# Patient Record
Sex: Female | Born: 1945 | Race: White | Hispanic: No | Smoking: Never smoker
Health system: Southern US, Community
[De-identification: ages and names within clinical notes are randomized; demographics above are authoritative.]

## PROBLEM LIST (undated history)

## (undated) DIAGNOSIS — E7800 Pure hypercholesterolemia, unspecified: Secondary | ICD-10-CM

## (undated) DIAGNOSIS — I10 Essential (primary) hypertension: Secondary | ICD-10-CM

---

## 1997-08-30 ENCOUNTER — Ambulatory Visit (HOSPITAL_COMMUNITY): Admission: RE | Admit: 1997-08-30 | Discharge: 1997-08-30 | Payer: Self-pay | Admitting: Orthopedic Surgery

## 1999-03-13 ENCOUNTER — Other Ambulatory Visit: Admission: RE | Admit: 1999-03-13 | Discharge: 1999-03-13 | Payer: Self-pay | Admitting: *Deleted

## 2000-01-24 ENCOUNTER — Ambulatory Visit (HOSPITAL_BASED_OUTPATIENT_CLINIC_OR_DEPARTMENT_OTHER): Admission: RE | Admit: 2000-01-24 | Discharge: 2000-01-24 | Payer: Self-pay | Admitting: Surgery

## 2000-01-24 ENCOUNTER — Encounter (INDEPENDENT_AMBULATORY_CARE_PROVIDER_SITE_OTHER): Payer: Self-pay | Admitting: *Deleted

## 2000-05-08 ENCOUNTER — Other Ambulatory Visit: Admission: RE | Admit: 2000-05-08 | Discharge: 2000-05-08 | Payer: Self-pay | Admitting: *Deleted

## 2001-09-03 ENCOUNTER — Other Ambulatory Visit: Admission: RE | Admit: 2001-09-03 | Discharge: 2001-09-03 | Payer: Self-pay | Admitting: *Deleted

## 2003-01-17 ENCOUNTER — Other Ambulatory Visit: Admission: RE | Admit: 2003-01-17 | Discharge: 2003-01-17 | Payer: Self-pay | Admitting: *Deleted

## 2004-01-24 ENCOUNTER — Other Ambulatory Visit: Admission: RE | Admit: 2004-01-24 | Discharge: 2004-01-24 | Payer: Self-pay | Admitting: *Deleted

## 2005-01-09 ENCOUNTER — Ambulatory Visit: Payer: Self-pay | Admitting: Gastroenterology

## 2005-01-18 ENCOUNTER — Ambulatory Visit: Payer: Self-pay | Admitting: Gastroenterology

## 2005-01-23 ENCOUNTER — Other Ambulatory Visit: Admission: RE | Admit: 2005-01-23 | Discharge: 2005-01-23 | Payer: Self-pay | Admitting: *Deleted

## 2006-12-03 ENCOUNTER — Other Ambulatory Visit: Admission: RE | Admit: 2006-12-03 | Discharge: 2006-12-03 | Payer: Self-pay | Admitting: *Deleted

## 2014-05-27 ENCOUNTER — Encounter (HOSPITAL_COMMUNITY): Payer: Self-pay

## 2014-05-27 ENCOUNTER — Emergency Department (INDEPENDENT_AMBULATORY_CARE_PROVIDER_SITE_OTHER): Payer: 59

## 2014-05-27 ENCOUNTER — Emergency Department (HOSPITAL_COMMUNITY)
Admission: EM | Admit: 2014-05-27 | Discharge: 2014-05-27 | Disposition: A | Discharge status: Home / Self Care | Payer: 59 | Source: Home / Self Care | Attending: Family Medicine | Admitting: Family Medicine

## 2014-05-27 DIAGNOSIS — S52602A Unspecified fracture of lower end of left ulna, initial encounter for closed fracture: Secondary | ICD-10-CM

## 2014-05-27 MED ORDER — HYDROCODONE-ACETAMINOPHEN 5-325 MG PO TABS: 1.0000 | ORAL_TABLET | ORAL | Status: DC | PRN

## 2014-05-27 MED ORDER — HYDROCODONE-ACETAMINOPHEN 5-325 MG PO TABS
ORAL_TABLET | ORAL | Status: AC
  Filled 2014-05-27: qty 1

## 2014-05-27 MED ORDER — HYDROCODONE-ACETAMINOPHEN 5-325 MG PO TABS
1.0000 | ORAL_TABLET | Freq: Once | ORAL | Status: AC
  Administered 2014-05-27: 1 via ORAL

## 2014-05-27 NOTE — Progress Notes (Signed)
Orthopedic Tech Progress Note Patient Details:  Alicia James 02/07/1946 [id number] Applied fiberglass ulnar gutter splint to LUE.  Pulses, sensation, motion intact before and after splinting.  Capillary refill less than 2 seconds before and after splinting.  Placed splinted LUE in arm sling. Ortho Devices Type of Ortho Device: Ulna gutter splint Ortho Device/Splint Location: LUE Ortho Device/Splint Interventions: Application   Lesle ChrisGilliland, Abdoulaye Drum L 05/27/2014, 4:42 PM

## 2014-05-27 NOTE — ED Provider Notes (Signed)
CSN: [id number]     Arrival date & time 05/27/14  1357 History   First MD Initiated Contact with Patient 05/27/14 1434     Chief Complaint  Patient presents with  . Fall   (Consider location/radiation/quality/duration/timing/severity/associated sxs/prior Treatment) HPI Comments: 69 year old female playing with her dog this morning and she tripped and fell on her left outstretched arm. She is complaining of pain primarily to the wrist. She denies injuring her head, neck, chest, back, right upper extremity or lower extremities. She is fully awake and alert and oriented.   History reviewed. No pertinent past medical history. History reviewed. No pertinent past surgical history. History reviewed. No pertinent family history. History  Substance Use Topics  . Smoking status: Never Smoker   . Smokeless tobacco: Not on file  . Alcohol Use: No   OB History    No data available     Review of Systems  Constitutional: Negative.   Respiratory: Negative for cough and shortness of breath.   Cardiovascular: Negative for chest pain.  Gastrointestinal: Negative.   Musculoskeletal: Positive for joint swelling and arthralgias. Negative for neck pain and neck stiffness.       As per history of present illness  Neurological: Negative for dizziness, tremors, syncope, speech difficulty, weakness and headaches.    Allergies  Review of patient's allergies indicates no known allergies.  Home Medications   Prior to Admission medications   Medication Sig Start Date End Date Taking? Authorizing Provider  HYDROcodone-acetaminophen (NORCO/VICODIN) 5-325 MG per tablet Take 1 tablet by mouth every 4 (four) hours as needed. 05/27/14   Hayden Rasmussenavid Godson Pollan, NP   BP 167/75 mmHg  Pulse 72  Temp(Src) 98.4 F (36.9 C) (Oral)  Resp 14  SpO2 96% Physical Exam  Constitutional: She is oriented to person, place, and time. She appears well-developed and well-nourished. No distress.  Eyes: Conjunctivae and EOM are normal.   Neck: Normal range of motion. Neck supple.  Pulmonary/Chest: Effort normal. No respiratory distress.  Musculoskeletal:  Tenderness to the ulnar aspect of the left wrist. Minor local swelling. Patient is able to partially flex the wrist but unable to hyperextend it. Radial pulses 2+. Ulnar pulse 1+. Is able to flex all digits and make a fist. Extension of digits is normal. No tenderness to the hand or forearm. No elbow tenderness. Elbow range of motion is complete. Distal neurovascular motor Sentry is intact.  Neurological: She is alert and oriented to person, place, and time. She exhibits normal muscle tone.  Psychiatric: She has a normal mood and affect.  Nursing note and vitals reviewed.   ED Course  Procedures (including critical care time) Labs Review Labs Reviewed - No data to display  Imaging Review Dg Wrist Complete Left  05/27/2014   CLINICAL DATA:  Fall wall line with followup  EXAM: LEFT WRIST - COMPLETE 3+ VIEW  COMPARISON:  None.  FINDINGS: No fracture of the distal radius.  Radiocarpal joint is intact.  There is swelling of the distal ulna laterally. There is a small ossific fragment between the ulna and the carpal row. Suspicion for avulsion fracture. Donor site is not identified.  IMPRESSION: Avulsion fracture between the distal ulna and carpal roe without clear donor site.   Electronically Signed   By: Genevive BiStewart  Edmunds M.D.   On: 05/27/2014 15:25     MDM   1. Closed fracture of ulna, distal end, left, initial encounter    Ulnar gutter splint, elevate, arm sling norco 5 mg #15 Call your  ortho or the above on call office for appt to see next week.    Hayden Rasmussen, NP 05/27/14 [phone]

## 2014-05-27 NOTE — ED Notes (Signed)
Ortho tech paged for splint application 

## 2014-05-27 NOTE — ED Notes (Addendum)
Reportedly fell earlier today  while in yard w her dog, injury w ? Deformity

## 2014-05-27 NOTE — Discharge Instructions (Signed)
Cast or Splint Care °Casts and splints support injured limbs and keep bones from moving while they heal. It is important to care for your cast or splint at home.   °HOME CARE INSTRUCTIONS °· Keep the cast or splint uncovered during the drying period. It can take 24 to 48 hours to dry if it is made of plaster. A fiberglass cast will dry in less than 1 hour. °· Do not rest the cast on anything harder than a pillow for the first 24 hours. °· Do not put weight on your injured limb or apply pressure to the cast until your health care provider gives you permission. °· Keep the cast or splint dry. Wet casts or splints can lose their shape and may not support the limb as well. A wet cast that has lost its shape can also create harmful pressure on your skin when it dries. Also, wet skin can become infected. °· Cover the cast or splint with a plastic bag when bathing or when out in the rain or snow. If the cast is on the trunk of the body, take sponge baths until the cast is removed. °· If your cast does become wet, dry it with a towel or a blow dryer on the cool setting only. °· Keep your cast or splint clean. Soiled casts may be wiped with a moistened cloth. °· Do not place any hard or soft foreign objects under your cast or splint, such as cotton, toilet paper, lotion, or powder. °· Do not try to scratch the skin under the cast with any object. The object could get stuck inside the cast. Also, scratching could lead to an infection. If itching is a problem, use a blow dryer on a cool setting to relieve discomfort. °· Do not trim or cut your cast or remove padding from inside of it. °· Exercise all joints next to the injury that are not immobilized by the cast or splint. For example, if you have a long leg cast, exercise the hip joint and toes. If you have an arm cast or splint, exercise the shoulder, elbow, thumb, and fingers. °· Elevate your injured arm or leg on 1 or 2 pillows for the first 1 to 3 days to decrease  swelling and pain. It is best if you can comfortably elevate your cast so it is higher than your heart. °SEEK MEDICAL CARE IF:  °· Your cast or splint cracks. °· Your cast or splint is too tight or too loose. °· You have unbearable itching inside the cast. °· Your cast becomes wet or develops a soft spot or area. °· You have a bad smell coming from inside your cast. °· You get an object stuck under your cast. °· Your skin around the cast becomes red or raw. °· You have new pain or worsening pain after the cast has been applied. °SEEK IMMEDIATE MEDICAL CARE IF:  °· You have fluid leaking through the cast. °· You are unable to move your fingers or toes. °· You have discolored (blue or white), cool, painful, or very swollen fingers or toes beyond the cast. °· You have tingling or numbness around the injured area. °· You have severe pain or pressure under the cast. °· You have any difficulty with your breathing or have shortness of breath. °· You have chest pain. °Document Released: 04/05/2000 Document Revised: 01/27/2013 Document Reviewed: 10/15/2012 °ExitCare® Patient Information ©2015 [employer]. This information is not intended to replace advice given to you by your health care   provider. Make sure you discuss any questions you have with your health care provider.  Ulnar Fracture You have a fracture (broken bone) of the forearm. This is the part of your arm between the elbow and your wrist. Your forearm is made up of two bones. These are the radius and ulna. Your fracture is in the ulna. This is the bone in your forearm located on the little finger side of your forearm. A cast or splint is used to protect and keep your injured bone from moving. The cast or splint will be on generally for about 5 to 6 weeks, with individual variations. HOME CARE INSTRUCTIONS   Keep the injured part elevated while sitting or lying down. Keep the injury above the level of your heart (the center of the chest). This will decrease  swelling and pain.  Apply ice to the injury for 15-20 minutes, 03-04 times per day while awake, for 2 days. Put the ice in a plastic bag and place a towel between the bag of ice and your cast or splint.  Move your fingers to avoid stiffness and minimize swelling.  If you have a plaster or fiberglass cast:  Do not try to scratch the skin under the cast using sharp or pointed objects.  Check the skin around the cast every day. You may put lotion on any red or sore areas.  Keep your cast dry and clean.  If you have a plaster splint:  Wear the splint as directed.  You may loosen the elastic around the splint if your fingers become numb, tingle, or turn cold or blue.  Do not put pressure on any part of your cast or splint. It may break. Rest your cast only on a pillow the first 24 hours until it is fully hardened.  Your cast or splint can be protected during bathing with a plastic bag. Do not lower the cast or splint into water.  Only take over-the-counter or prescription medicines for pain, discomfort, or fever as directed by your caregiver. SEEK IMMEDIATE MEDICAL CARE IF:   Your cast gets damaged or breaks.  You have more severe pain or swelling than you did before the cast.  You have severe pain when stretching your fingers.  There is a bad smell or new stains and/or purulent (pus like) drainage coming from under the cast. Document Released: 09/19/2005 Document Revised: 07/01/2011 Document Reviewed: 02/21/2007 ExitCare Patient Information 2015 [place], [place]. This information is not intended to replace advice given to you by your health care provider. Make sure you discuss any questions you have with your health care provider.  Wrist Fracture A wrist fracture is a break or crack in one of the bones of your wrist. Your wrist is made up of eight small bones at the palm of your hand (carpal bones) and two long bones that make up your forearm (radius and ulna). The goal of treatment  is to hold the injured bone in place while it heals. Surgery may or may not be needed to care for your injured wrist.  HOME CARE  Keep your injured wrist raised (elevated). Move your fingers as much as you can.  Do not put pressure on any part of your cast or splint. It may break.  Use a plastic bag to protect your cast or splint from water while bathing or showering. Do not lower your cast or splint into water.  Take medicines only as told by your doctor.  Keep your cast or splint clean  and dry. If it gets wet, damaged, or suddenly feels too tight, tell your doctor right away.  Do not use any tobacco products including cigarettes, chewing tobacco, or electronic cigarettes. Tobacco can slow bone healing. If you need help quitting, ask your doctor.  Keep all follow-up visits as told by your doctor. This is important.  Ask your doctor if you should take supplements of calcium and vitamins C and D. GET HELP IF:   Your cast or splint is damaged, breaks, or gets wet.  You have a fever.  You have chills.  You have very bad pain that does not go away.  You have more swelling (inflammation) than before the cast was put on. GET HELP RIGHT AWAY IF:   Your hand or fingernails on the injured arm turn blue or gray, or feel cold or numb.  You lose some feeling in the fingers of your injured arm. MAKE SURE YOU:   Understand these instructions.  Will watch your condition.  Will get help right away if you are not doing well or get worse. Document Released: 09/25/2007 Document Revised: 08/23/2013 Document Reviewed: 10/21/2011 [facility] Patient Information 2015 [place]. This information is not intended to replace advice given to you by your health care provider. Make sure you discuss any questions you have with your health care provider.

## 2014-05-27 NOTE — ED Notes (Signed)
Ice pack for pain/swelling

## 2014-12-12 ENCOUNTER — Encounter: Payer: Self-pay | Admitting: Gastroenterology

## 2015-01-04 ENCOUNTER — Encounter: Payer: Self-pay | Admitting: Obstetrics and Gynecology

## 2015-11-14 ENCOUNTER — Encounter: Payer: Self-pay | Admitting: Internal Medicine

## 2016-10-29 ENCOUNTER — Other Ambulatory Visit (HOSPITAL_COMMUNITY): Payer: Self-pay | Admitting: Internal Medicine

## 2016-10-29 ENCOUNTER — Ambulatory Visit (HOSPITAL_COMMUNITY)
Admission: RE | Admit: 2016-10-29 | Discharge: 2016-10-29 | Disposition: A | Discharge status: Home / Self Care | Payer: 59 | Source: Ambulatory Visit | Attending: Vascular Surgery | Admitting: Vascular Surgery

## 2016-10-29 DIAGNOSIS — H538 Other visual disturbances: Secondary | ICD-10-CM | POA: Insufficient documentation

## 2016-10-29 LAB — VAS US CAROTID
LCCADDIAS: 20 cm/s
LCCADSYS: 76 cm/s
LCCAPDIAS: 18 cm/s
LEFT ECA DIAS: -10 cm/s
LICADDIAS: -23 cm/s
LICAPDIAS: -22 cm/s
Left CCA prox sys: 95 cm/s
Left ICA dist sys: -68 cm/s
Left ICA prox sys: -82 cm/s
RCCADSYS: -92 cm/s
RCCAPDIAS: 15 cm/s
RCCAPSYS: 97 cm/s
RIGHT CCA MID DIAS: 16 cm/s
RIGHT ECA DIAS: -10 cm/s

## 2018-12-02 ENCOUNTER — Other Ambulatory Visit: Payer: Self-pay | Admitting: Internal Medicine

## 2018-12-02 DIAGNOSIS — E785 Hyperlipidemia, unspecified: Secondary | ICD-10-CM

## 2018-12-14 ENCOUNTER — Ambulatory Visit
Admission: RE | Admit: 2018-12-14 | Discharge: 2018-12-14 | Disposition: A | Discharge status: Home / Self Care | Payer: 59 | Source: Ambulatory Visit | Attending: Internal Medicine | Admitting: Internal Medicine

## 2018-12-14 DIAGNOSIS — E785 Hyperlipidemia, unspecified: Secondary | ICD-10-CM

## 2019-04-01 ENCOUNTER — Other Ambulatory Visit: Payer: Self-pay

## 2019-04-01 DIAGNOSIS — Z20822 Contact with and (suspected) exposure to covid-19: Secondary | ICD-10-CM

## 2019-04-03 LAB — NOVEL CORONAVIRUS, NAA: SARS-CoV-2, NAA: NOT DETECTED

## 2019-04-08 ENCOUNTER — Other Ambulatory Visit: Payer: 59

## 2019-04-12 ENCOUNTER — Other Ambulatory Visit: Payer: 59

## 2019-06-29 ENCOUNTER — Emergency Department (HOSPITAL_COMMUNITY)
Admission: EM | Admit: 2019-06-29 | Discharge: 2019-06-29 | Disposition: A | Discharge status: Home / Self Care | Payer: 59 | Attending: Emergency Medicine | Admitting: Emergency Medicine

## 2019-06-29 ENCOUNTER — Other Ambulatory Visit: Payer: Self-pay

## 2019-06-29 ENCOUNTER — Emergency Department (HOSPITAL_COMMUNITY): Payer: 59

## 2019-06-29 ENCOUNTER — Encounter (HOSPITAL_COMMUNITY): Payer: Self-pay

## 2019-06-29 DIAGNOSIS — Y939 Activity, unspecified: Secondary | ICD-10-CM | POA: Diagnosis not present

## 2019-06-29 DIAGNOSIS — I10 Essential (primary) hypertension: Secondary | ICD-10-CM | POA: Insufficient documentation

## 2019-06-29 DIAGNOSIS — S022XXA Fracture of nasal bones, initial encounter for closed fracture: Secondary | ICD-10-CM | POA: Diagnosis not present

## 2019-06-29 DIAGNOSIS — Y929 Unspecified place or not applicable: Secondary | ICD-10-CM | POA: Diagnosis not present

## 2019-06-29 DIAGNOSIS — S0993XA Unspecified injury of face, initial encounter: Secondary | ICD-10-CM | POA: Diagnosis present

## 2019-06-29 DIAGNOSIS — Y999 Unspecified external cause status: Secondary | ICD-10-CM | POA: Insufficient documentation

## 2019-06-29 DIAGNOSIS — W2209XA Striking against other stationary object, initial encounter: Secondary | ICD-10-CM | POA: Diagnosis not present

## 2019-06-29 HISTORY — DX: Essential (primary) hypertension: I10

## 2019-06-29 HISTORY — DX: Pure hypercholesterolemia, unspecified: E78.00

## 2019-06-29 MED ORDER — BACITRACIN ZINC 500 UNIT/GM EX OINT: TOPICAL_OINTMENT | Freq: Two times a day (BID) | CUTANEOUS | Status: DC

## 2019-06-29 MED ORDER — BACITRACIN ZINC 500 UNIT/GM EX OINT
TOPICAL_OINTMENT | CUTANEOUS | Status: AC
  Filled 2019-06-29: qty 0.9

## 2019-06-29 NOTE — ED Provider Notes (Signed)
[facility]-EMERGENCY DEPT Provider Note   CSN: [id number] Arrival date & time: 06/29/19  1151     History Chief Complaint  Patient presents with  . Facial Injury    Alicia James is a 74 y.o. female with a past medical history of HTN, HLD, who presents to ED with a chief complaint of nose injury.  States that she [place] in [place] son's birthday when she was looking down at her phone and ran into a door.  States that she hit her nose on the side of the door. Did blow her nose once with some streaks of blood but denies any other nosebleed.  Denies any trouble breathing through her nose.  Denies any loss of consciousness, vomiting, vision changes or anticoagulant use.  States that her last tetanus shot was within the past 5 years.  HPI     Past Medical History:  Diagnosis Date  . High cholesterol   . Hypertension     There are no problems to display for this patient.   History reviewed. No pertinent surgical history.   OB History   No obstetric history on file.     History reviewed. No pertinent family history.  Social History   Tobacco Use  . Smoking status: Never Smoker  . Smokeless tobacco: Never Used  Substance Use Topics  . Alcohol use: Yes  . Drug use: Never    Home Medications Prior to Admission medications   Medication Sig Start Date End Date Taking? Authorizing Provider  HYDROcodone-acetaminophen (NORCO/VICODIN) 5-325 MG per tablet Take 1 tablet by mouth every 4 (four) hours as needed. 05/27/14   Hayden Rasmussen, NP    Allergies    Other and Penicillins  Review of Systems   Review of Systems  Gastrointestinal: Negative for nausea and vomiting.  Skin: Positive for wound.  Neurological: Negative for syncope, light-headedness and headaches.    Physical Exam Updated Vital Signs BP (!) 148/72   Pulse 92   Temp 98.3 F (36.8 C) (Oral)   Resp 16   Ht 5' 2.5" (1.588 m)   Wt 52.2 kg   SpO2  100%   BMI 20.70 kg/m   Physical Exam Vitals and nursing note reviewed.  Constitutional:      General: She is not in acute distress.    Appearance: She is well-developed. She is not diaphoretic.  HENT:     Head: Normocephalic and atraumatic.     Right Ear: Tympanic membrane normal.     Left Ear: Tympanic membrane normal.     Nose:      Comments: 1cm linear abrasion on the nose without active bleeding. Eyes:     General: No scleral icterus.    Conjunctiva/sclera: Conjunctivae normal.  Pulmonary:     Effort: Pulmonary effort is normal. No respiratory distress.  Musculoskeletal:     Cervical back: Normal range of motion.  Skin:    Findings: No rash.  Neurological:     Mental Status: She is alert.     ED Results / Procedures / Treatments   Labs (all labs ordered are listed, but only abnormal results are displayed) Labs Reviewed - No data to display  EKG None  Radiology CT Maxillofacial Wo Contrast  Result Date: 06/29/2019 CLINICAL DATA:  Nasal trauma, struck side of door with nose. EXAM: CT MAXILLOFACIAL WITHOUT CONTRAST TECHNIQUE: Multidetector CT imaging of the maxillofacial structures was performed. Multiplanar CT image reconstructions were also  generated. COMPARISON:  None. FINDINGS: Osseous: Nondisplaced right nasal bone fracture. Nasal septum is midline. No additional facial bone fracture. Zygomatic arches and mandibles are intact. Temporomandibular joints are congruent with degenerative change. There are multiple missing teeth, chronic appearing. Orbits: No orbital or globe injury. Sinuses: No sinus fracture or fluid level. Paranasal sinuses are clear. Mastoid air cells are clear. Soft tissues: Mild nasal soft tissue edema. Limited intracranial: No significant or unexpected finding. IMPRESSION: Nondisplaced right nasal bone fracture. Electronically Signed   By: Keith Rake M.D.   On: 06/29/2019 13:11    Procedures Procedures (including critical care  time)  Medications Ordered in ED Medications  bacitracin ointment (has no administration in time range)  bacitracin 500 UNIT/GM ointment (has no administration in time range)    ED Course  I have reviewed the triage vital signs and the nursing notes.  Pertinent labs & imaging results that were available during my care of the patient were reviewed by me and considered in my medical decision making (see chart for details).    MDM Rules/Calculators/A&P                      74 year old female presents to the ED for nose injury that occurred prior to arrival.  States that she was in a hurry and hit the bridge of her nose on the edge of a door.  Denies any loss of consciousness.  Denies headache.  Reports pain at the site of the nose.  Did have some streaks of blood when she blew her nose prior to arrival but denies any other epistaxis.  On exam patient is overall well-appearing.  Tenderness palpation of the bridge of the nose without deformities noted.  No septal hematoma noted.  There is an abrasion present on the bridge of the nose that does not appear deep.  CT shows nondisplaced right nasal bone fracture.  Patient educated on precautions regarding nasal bone fractures.  Advised on wound care and ENT follow-up.  Patient is hemodynamically stable, in NAD, and able to ambulate in the ED. Evaluation does not show pathology that would require ongoing emergent intervention or inpatient treatment. I have personally reviewed and interpreted all lab work and imaging at today's ED visit. I explained the diagnosis to the patient. Pain has been managed and has no complaints prior to discharge. Patient is comfortable with above plan and is stable for discharge at this time. All questions were answered prior to disposition. Strict return precautions for returning to the ED were discussed. Encouraged follow up with PCP.   An After Visit Summary was printed and given to the patient.   Portions of this note  were generated with [employer]. Dictation errors may occur despite best attempts at proofreading.  Final Clinical Impression(s) / ED Diagnoses Final diagnoses:  Closed fracture of nasal bone, initial encounter    Rx / DC Orders ED Discharge Orders    None       Delia Heady, PA-C 06/29/19 1357    Tegeler, Gwenyth Allegra, MD 06/29/19 1740

## 2019-06-29 NOTE — Discharge Instructions (Signed)
Use neosporin in the area. Follow-up with the ENT specialist listed below regarding your nasal fracture. You will need to refrain from blowing your nose. Return to the ER if you have additional head injuries, numbness in arms or legs, blurry vision, chest pain or shortness of breath.

## 2019-06-29 NOTE — ED Triage Notes (Signed)
Patient states that she was in a hurry and hit the side of the door with her nose. Patient denies any blood thinners.

## 2020-06-12 ENCOUNTER — Other Ambulatory Visit: Payer: Self-pay

## 2020-06-12 ENCOUNTER — Ambulatory Visit: Payer: Self-pay

## 2020-06-12 ENCOUNTER — Ambulatory Visit (INDEPENDENT_AMBULATORY_CARE_PROVIDER_SITE_OTHER): Payer: 59

## 2020-06-12 ENCOUNTER — Encounter: Payer: Self-pay | Admitting: Family Medicine

## 2020-06-12 ENCOUNTER — Ambulatory Visit (INDEPENDENT_AMBULATORY_CARE_PROVIDER_SITE_OTHER): Payer: 59 | Admitting: Family Medicine

## 2020-06-12 DIAGNOSIS — M25512 Pain in left shoulder: Secondary | ICD-10-CM

## 2020-06-12 DIAGNOSIS — M25511 Pain in right shoulder: Secondary | ICD-10-CM

## 2020-06-12 MED ORDER — COLCHICINE 0.6 MG PO CAPS
1.0000 | ORAL_CAPSULE | Freq: Two times a day (BID) | ORAL | 3 refills | Status: AC | PRN
Start: 2020-06-12 — End: ?

## 2020-06-12 MED ORDER — NABUMETONE 500 MG PO TABS: 500.0000 mg | ORAL_TABLET | Freq: Two times a day (BID) | ORAL | 3 refills | Status: AC | PRN

## 2020-06-12 NOTE — Progress Notes (Signed)
   Office Visit Note   Patient: Alicia James           Date of Birth: 1945/12/12           MRN: [id number] Visit Date: 06/12/2020 Requested by: Jarome Matin, MD [address] [place],  [place] [address] PCP: Jarome Matin, MD  Subjective: Chief Complaint  Patient presents with  . Right Shoulder - Pain    Pain bilateral shoulders, right more than left, x over a month. Decreased ROM in the right arm.  . Left Shoulder - Pain    HPI: She is here with bilateral shoulder pain.  Symptoms started about a month ago, no injury.  Pain and stiffness worse in the morning and better as the day goes on.  Heat, Tylenol and ibuprofen have given her some relief.  She has a history of osteoarthritis affecting multiple joints.  She also has osteoporosis.  She is on pravastatin for hyperlipidemia for the past year.  She has a history of gout when she was younger.  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              ROS:   All other systems were reviewed and are negative.  Objective: Vital Signs: There were no vitals taken for this visit.  Physical Exam:  General:  Alert and oriented, in no acute distress. Pulm:  Breathing unlabored. Psy:  Normal mood, congruent affect.  Shoulders: She still has full bilateral shoulder range of motion with no adhesive capsulitis.  There is some palpable crepitus with active range of motion.  Rotator cuff strength is 5/5 throughout, mild pain with empty can test bilaterally.  Remainder of upper extremity strength is normal.    Imaging: XR Shoulder Left  Result Date: 06/12/2020 X-rays of the left shoulder reveal mild glenohumeral and AC joint degenerative change.  No soft tissue calcification, no sign of neoplasm.  Lung fields are clear.  XR Shoulder Right  Result Date: 06/12/2020 X-rays of the right shoulder reveal mild glenohumeral degenerative change.  No sign of neoplasm.  Lung fields look clear.   Assessment &  Plan: 1.  Bilateral shoulder pain, possibly impingement versus pain from glenohumeral DJD.  Cannot rule out gout. -We will try glucosamine and turmeric, colchicine as needed, Relafen as needed. -If symptoms persist, possibly physical therapy versus a one-time subacromial injection.     Procedures: No procedures performed        PMFS History: There are no problems to display for this patient.  Past Medical History:  Diagnosis Date  . High cholesterol   . Hypertension     History reviewed. No pertinent family history.  History reviewed. No pertinent surgical history. Social History   Occupational History  . Not on file  Tobacco Use  . Smoking status: Never Smoker  . Smokeless tobacco: Never Used  Vaping Use  . Vaping Use: Never used  Substance and Sexual Activity  . Alcohol use: Yes  . Drug use: Never  . Sexual activity: Not on file

## 2020-06-28 ENCOUNTER — Ambulatory Visit: Payer: 59 | Admitting: Family Medicine

## 2020-07-26 ENCOUNTER — Ambulatory Visit: Payer: Self-pay

## 2020-07-26 ENCOUNTER — Ambulatory Visit (INDEPENDENT_AMBULATORY_CARE_PROVIDER_SITE_OTHER): Payer: Medicare Other | Admitting: Family Medicine

## 2020-07-26 ENCOUNTER — Encounter: Payer: Self-pay | Admitting: Family Medicine

## 2020-07-26 ENCOUNTER — Other Ambulatory Visit: Payer: Self-pay

## 2020-07-26 DIAGNOSIS — M25511 Pain in right shoulder: Secondary | ICD-10-CM

## 2020-07-26 NOTE — Progress Notes (Signed)
I saw and examined the patient with Dr. Marga Hoots and agree with assessment and plan as outlined.    Ongoing pain, no tear seen on ultrasound.  Injected subacromial space with good immediate results.   Will start home exercises.

## 2020-07-26 NOTE — Progress Notes (Signed)
Office Visit Note   Patient: Alicia James           Date of Birth: [birth year]           MRN: [id number] Visit Date: 07/26/2020 Requested by: Jarome Matin, MD [address] [place],  [place] [address] PCP: Jarome Matin, MD  Subjective: Chief Complaint  Patient presents with  . Right Shoulder - Pain    Pain and ROM worse in the shoulder. The prescribed medication was "eating up her stomach." Is taking an Advil/tylenol combo.     HPI: 75yo F presenting to clinic with worsening right shoulder pain. At previous encounter, patient was given Meloxicam, but states she wasn't able to tolerate this due to stomach upset. She has tried doing some exercises (Yoga), but says her shoulder pain is so great it stops her from being able to do anything. She says 'I can barely get dressed!' No neck pain, no numbness/tingling. States she thinks this happened when she was walking her dog and the pup pulled hard on the leash. She tried self-care with CBD oil and Advil, which helps minimally.               ROS:   All other systems were reviewed and are negative.  Objective: Vital Signs: There were no vitals taken for this visit.  Physical Exam:  General:  Alert and oriented, in no acute distress. Pulm:  Breathing unlabored. Psy:  Normal mood, congruent affect. Skin:  Right arm/shoulder with no bruising, erythema, or rashes. Overlying skin intact.   Right Shoulder Exam:  Inspection: Symmetric muscle mass, no atrophy or deformity, no scars. Palpation: Endorses tenderness within the subacromial/deltoid area. No tenderness over [facility] Joint or biceps insertion.   Range of motion: Significantly reduced active ROM with any abduction. She is, however, able to tolerate passive ROM.  Drop arm negative, though extremely painful.  Rotator cuff testing:  Reduced strength and significant pain with empty can (supraspinatus).  External and internal rotation with full strength and no pain.  Strength testing:   5 out of 5 strength with wrist extension (C6), wrist flexion (C7), grip strength (C8), and finger abduction (T1).  Sensation: Intact to light touch throughout bilateral upper extremities.   Brisk distal capillary refill.   Imaging: Ultrasound: Right Shoulder-  Biceps tendon visualized in long and short axis, with no significant surrounding fluid.  Fibers appear intact. Subscapularis tendon intact, with no significant surrounding effusion. Supraspinatus appears intact, though there are scattered calcifications- most obvious within the anterior aspect. No surrounding effusions. No significant enlargement of subacromial bursa appreciated.   Assessment & Plan: 75yo F presenting to clinic with worsening right shoulder pain. Due to complete intolerance of active abduction, US performed to assure no complete RTC tear, and this was very reassuring.  - Subacromial injection therapy discussed, and patient opted to proceed. CSI performed as described below, which patient tolerated very well. She voiced immediate improvement in her symptoms following injection, and was better able to tolerate ROM testing. - Provided with HEP for rotator cuff strengthening once her pain allows - RTC if no benefit within 2-3 weeks. - Patient expresses understanding with plan. She has no further questions or concerns today.      Procedures: Right Subacromial Cortisone Injection:  Risks and benefits of procedure discussed, Patient opted to proceed. Verbal Consent obtained.  Timeout performed.  Skin prepped with alcohol. Ethyl Chloride was used for topical analgesia.  Right subacromial space was injected with 3cc 0.25% Bupivacaine  without epinephrine via the posterior approach using a 25G, 1.5in needle. Syringe was removed from the needle, and 40mg  methylprednisolone was then injected into the area.   Patient tolerated the injection well with no immediate complications. Aftercare instructions were discussed, and  patient was given strict return precautions.     PMFS History: There are no problems to display for this patient.  Past Medical History:  Diagnosis Date  . High cholesterol   . Hypertension     No family history on file.  No past surgical history on file. Social History   Occupational History  . Not on file  Tobacco Use  . Smoking status: Never Smoker  . Smokeless tobacco: Never Used  Vaping Use  . Vaping Use: Never used  Substance and Sexual Activity  . Alcohol use: Yes  . Drug use: Never  . Sexual activity: Not on file

## 2020-07-27 ENCOUNTER — Ambulatory Visit: Payer: 59 | Admitting: Family Medicine

## 2020-09-19 IMAGING — CT CT MAXILLOFACIAL W/O CM
3 series · 16 of 47 positions shown, 19 images · non-contrast
Comparison: None.

CLINICAL DATA: Nasal trauma, struck side of door with nose.

EXAM:
CT MAXILLOFACIAL WITHOUT CONTRAST
TECHNIQUE: Multidetector CT imaging of the maxillofacial structures was
performed. Multiplanar CT image reconstructions were also generated.

[Series 2: max soft · axial · 0.34mm/px · z∈[-199,-73]mm · 10 of 75 slices shown, 13 images]
[im 6/75  brain]
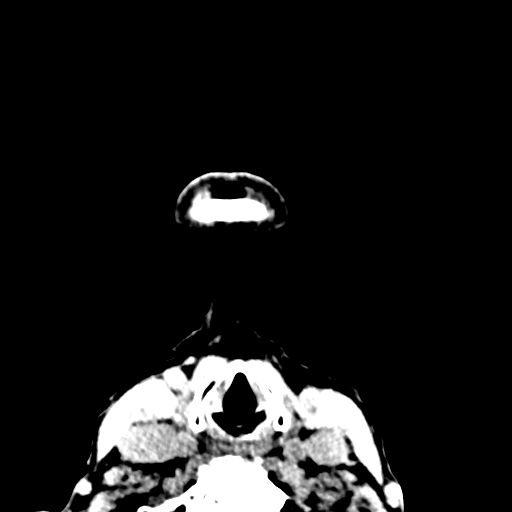
[im 6/75  bone]
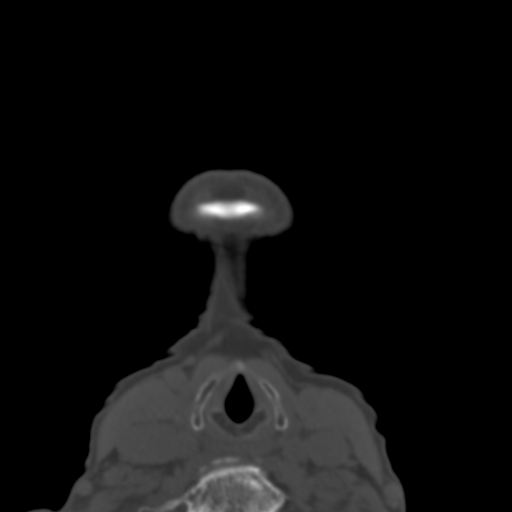
[im 13/75  bone]
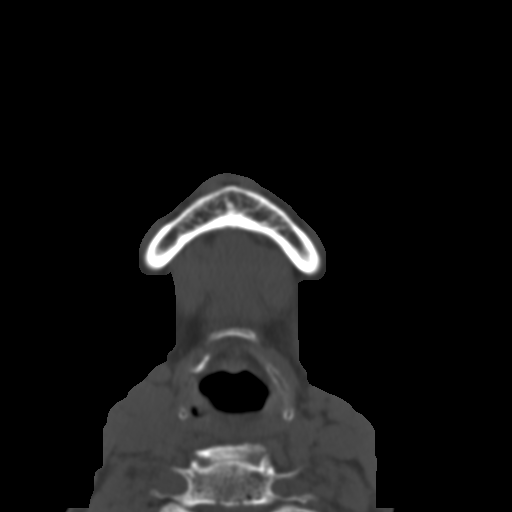
[im 21/75  bone]
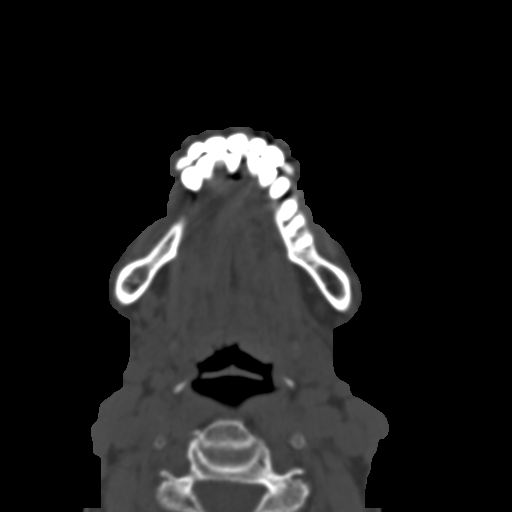
[im 26/75  bone]
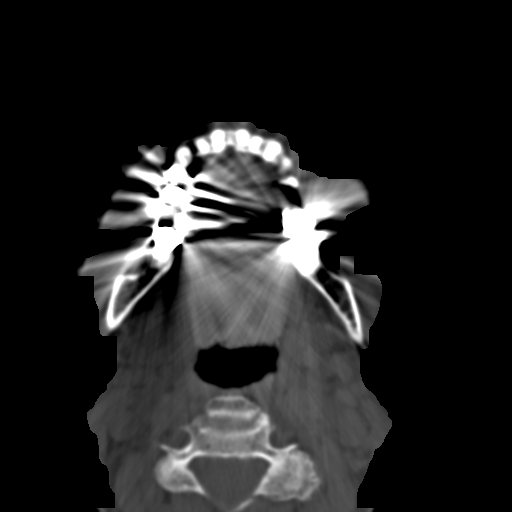
[im 34/75  brain]
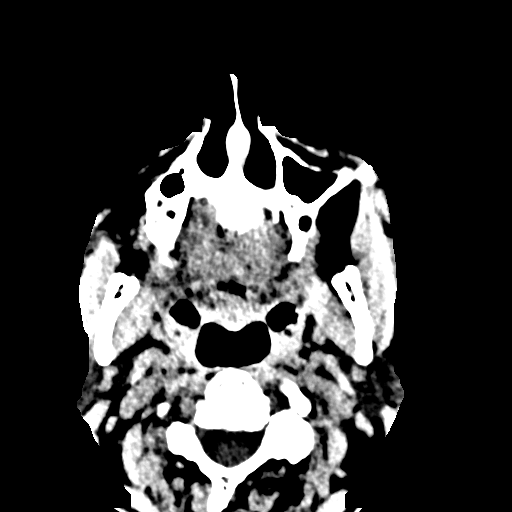
[im 34/75  bone]
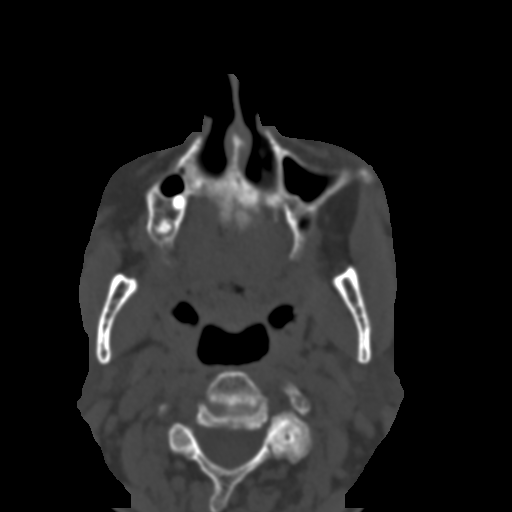
[im 41/75  bone]
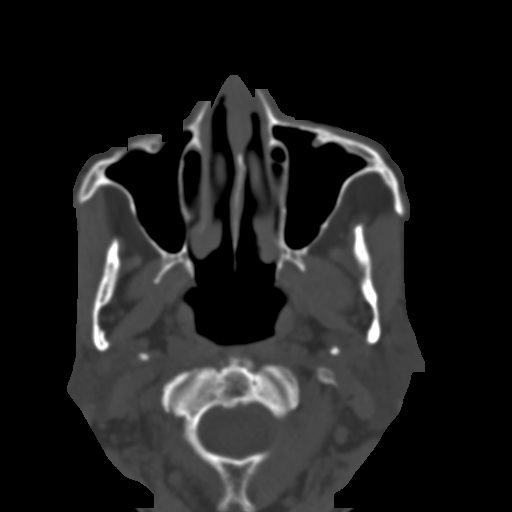
[im 49/75  bone]
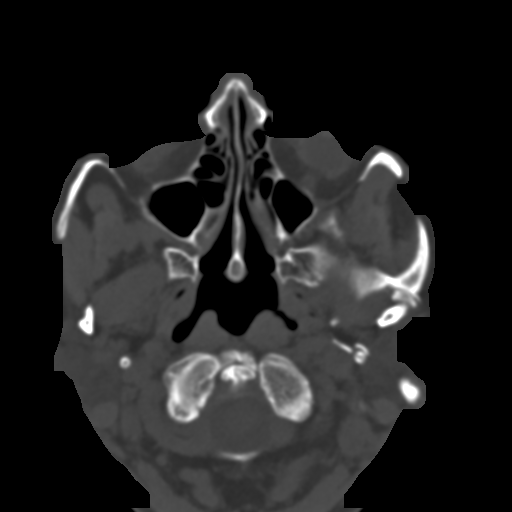
[im 57/75  bone]
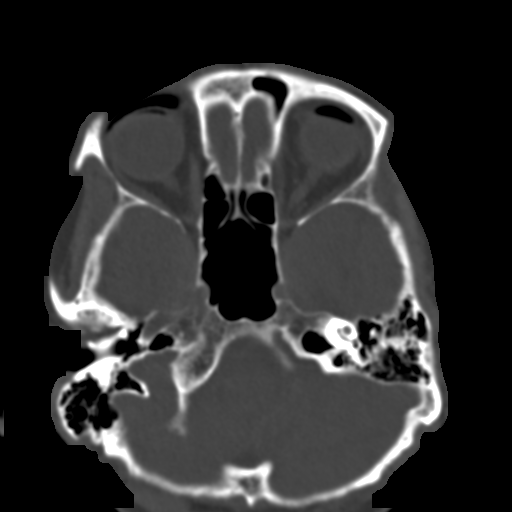
[im 62/75  brain]
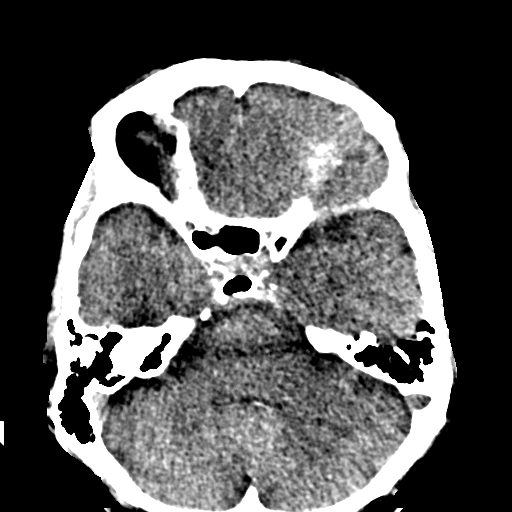
[im 62/75  bone]
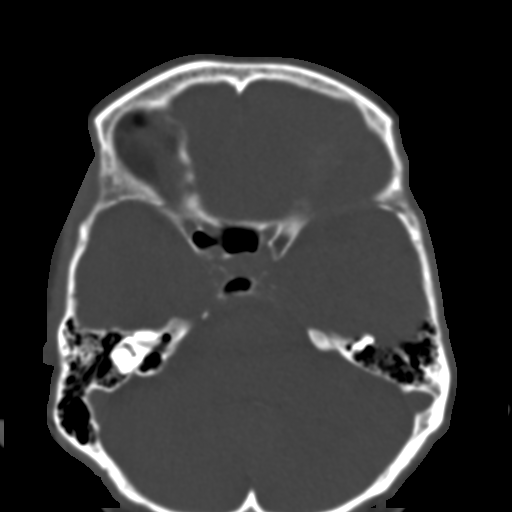
[im 69/75  bone]
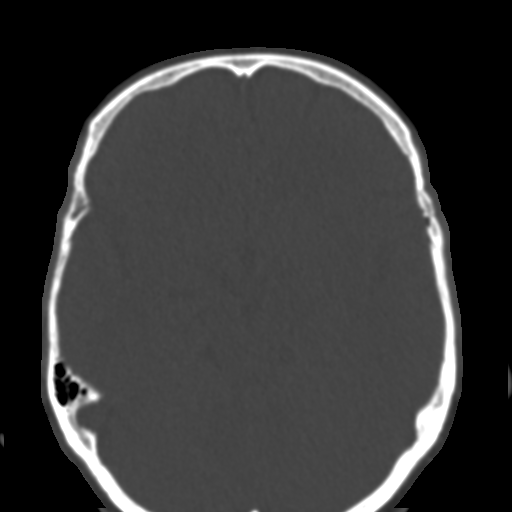

[Series 6: coronal soft · coronal · 0.31mm/px · 3 of 73 slices shown]
[im 25/73  bone]
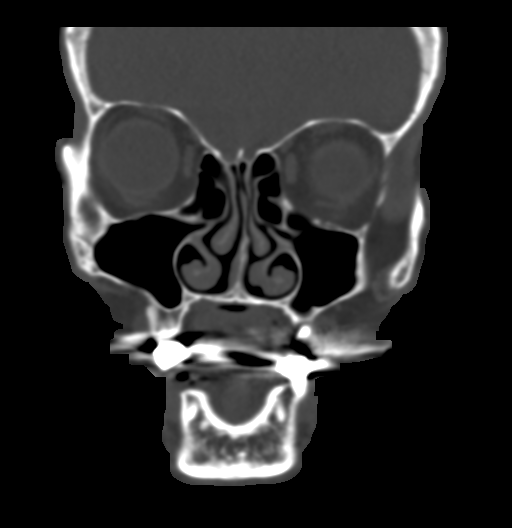
[im 33/73  bone]
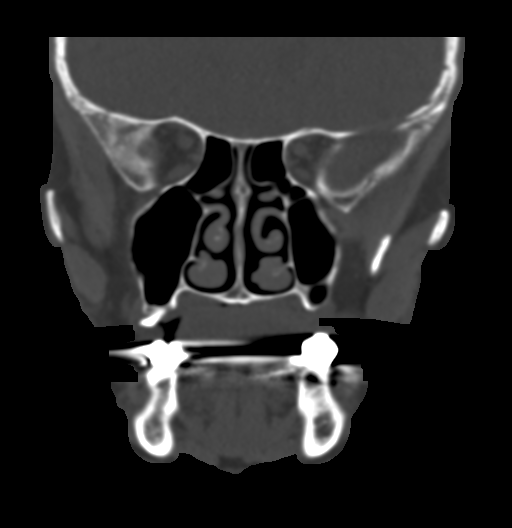
[im 41/73  bone]
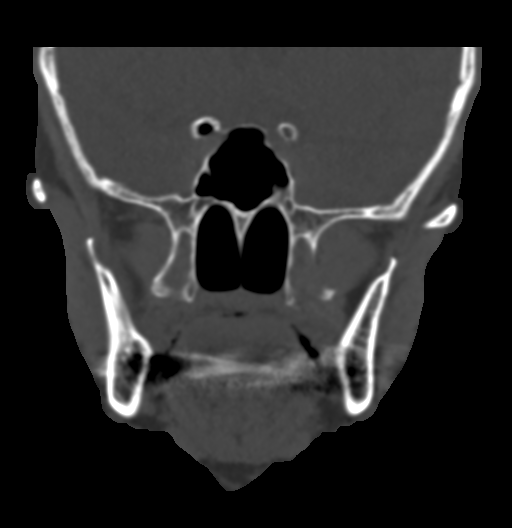

[Series 7: sagittal soft · sagittal · 0.29mm/px · 3 of 74 slices shown]
[im 25/74  bone]
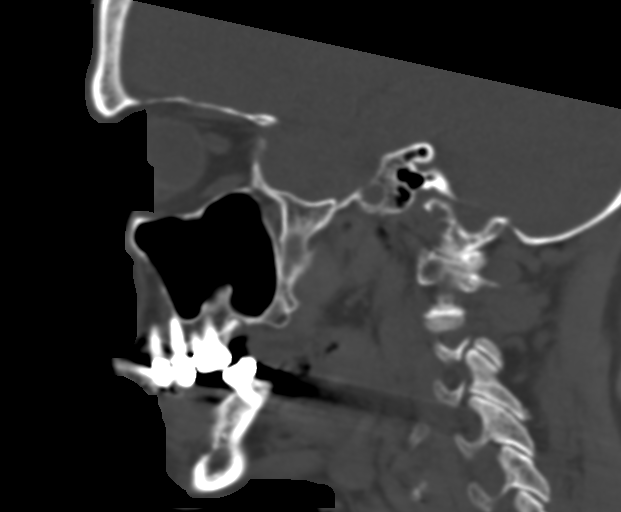
[im 37/74  bone]
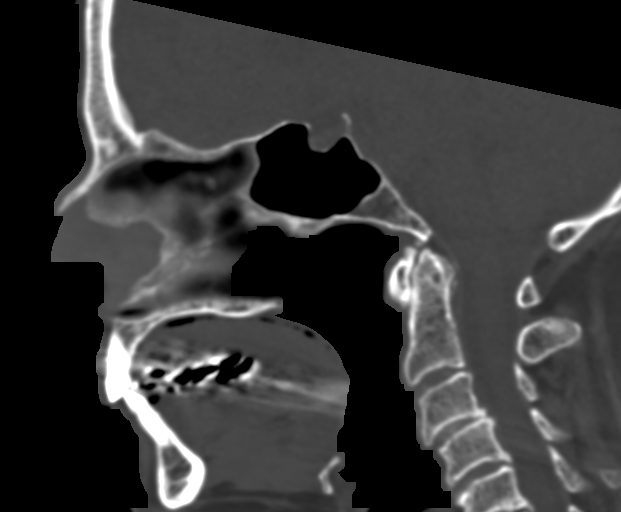
[im 49/74  bone]
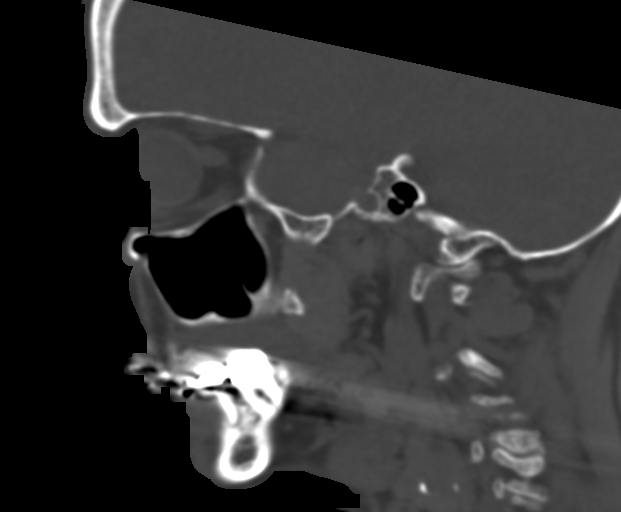

[16 of 47 positions shown; findings below may reference images not displayed]

FINDINGS: Osseous: Nondisplaced right nasal bone fracture. Nasal septum is
midline. No additional facial bone fracture. Zygomatic arches and
mandibles are intact. Temporomandibular joints are congruent with
degenerative change. There are multiple missing teeth, chronic
appearing.

Orbits: No orbital or globe injury.

Sinuses: No sinus fracture or fluid level. Paranasal sinuses are
clear. Mastoid air cells are clear.

Soft tissues: Mild nasal soft tissue edema.

Limited intracranial: No significant or unexpected finding.
IMPRESSION: Nondisplaced right nasal bone fracture.

## 2020-09-29 ENCOUNTER — Ambulatory Visit: Payer: Medicare Other | Admitting: Family Medicine

## 2020-10-18 ENCOUNTER — Other Ambulatory Visit: Payer: Self-pay

## 2020-10-18 ENCOUNTER — Ambulatory Visit (INDEPENDENT_AMBULATORY_CARE_PROVIDER_SITE_OTHER): Payer: Medicare Other | Admitting: Family Medicine

## 2020-10-18 ENCOUNTER — Encounter: Payer: Self-pay | Admitting: Family Medicine

## 2020-10-18 VITALS — BP 179/80 | HR 81 | Temp 98.2°F | Ht 62.5 in | Wt 129.8 lb

## 2020-10-18 DIAGNOSIS — M25512 Pain in left shoulder: Secondary | ICD-10-CM

## 2020-10-18 NOTE — Progress Notes (Signed)
I saw and examined the patient with Dr. Marga Hoots and agree with assessment and plan as outlined.    Left shoulder pain, improving.  Right shoulder better, but still not normal.    Leaving for [place] next month.  Will start PT at [facility].  Inject if worsens.

## 2020-10-18 NOTE — Progress Notes (Signed)
Office Visit Note   Patient: Alicia James           Date of Birth: [birth year]           MRN: [id number] Visit Date: 10/18/2020 Requested by: Jarome Matin, MD [address] [place],  [place] [address] PCP: Jarome Matin, MD  Subjective: Chief Complaint  Patient presents with   Left Shoulder - Pain    HPI: 75yo F presenting to clinic with concerns of left shoulder pain. Patient states she has struggled with shoulder pain for some time, but is typically able to push through it. No trauma that she can recall. She had similar pain on the right, which responded well (though not entirely) to a subacromial injection. She says she was given home exercises, though confesses she is not always diligent with performing them. She is leaving for [place] in a month, and is worried that this pain will limit her fun while on vacation. She does state that her pain has improved somewhat since initially making this appointment.   Objective: Vital Signs: BP (!) 179/80 (BP Location: Left Arm, Patient Position: Sitting, Cuff Size: Normal)   Pulse 81   Temp 98.2 F (36.8 C) (Oral)   Ht 5' 2.5" (1.588 m)   Wt 129 lb 12.8 oz (58.9 kg)   BMI 23.36 kg/m   Physical Exam:  General:  Alert and oriented, in no acute distress. Pulm:  Breathing unlabored. Psy:  Normal mood, congruent affect. Skin:  Bilateral shoulders with no bruising, rashes, or erythema.   Left Shoulder Exam:  Inspection: Symmetric muscle mass, no atrophy or deformity, no scars. Palpation: No tenderness to palpation over and around the acromion, over the [facility] joint or biceps insertion.  Range of motion: Full range of motion in forward flexion, abduction and extension- however endorses pain with abduction above 90*.  Full External rotation to 90 degrees.   Rotator cuff testing:  Somewhat reduced strength as well as pain with empty can (supraspinatus).  External and internal rotation with full strength and though endorses discomfort.    Crank test: Crepitus appreciated.   Impingement testing: Endorses pain with [place].    Imaging: No results found.  Assessment & Plan: 75yo F presenting to clinic with concerns of bilateral shoulder pain, in setting of RTC pathology on the right, and now similar symptoms on the left. Previously treated with HEP and Subacromial injections on the right, though admits she is not always diligent with her HEP.  - Will send to formal PT for both shoulder RTC strengthening - If not improved by [place] trip, RTC for consideration of subacromial injection to help improve her symptoms while on vacation- though optimistic her symptoms will improve with PT. - Patient expresses understanding with plan. She has no further questions or concerns today.       Procedures: No procedures performed        PMFS History: There are no problems to display for this patient.  Past Medical History:  Diagnosis Date   High cholesterol    Hypertension     No family history on file.  No past surgical history on file. Social History   Occupational History   Not on file  Tobacco Use   Smoking status: Never   Smokeless tobacco: Never  Vaping Use   Vaping Use: Never used  Substance and Sexual Activity   Alcohol use: Yes   Drug use: Never   Sexual activity: Not on file

## 2021-05-02 ENCOUNTER — Other Ambulatory Visit (HOSPITAL_COMMUNITY): Payer: Self-pay

## 2021-05-02 MED ORDER — FLUOROMETHOLONE 0.1 % OP SUSP
1.0000 [drp] | Freq: Four times a day (QID) | OPHTHALMIC | 0 refills | Status: AC
  Filled 2021-05-02: qty 10, 25d supply, fill #0

## 2021-05-10 ENCOUNTER — Other Ambulatory Visit (HOSPITAL_COMMUNITY): Payer: Self-pay
